# Patient Record
Sex: Female | Born: 1960 | Race: Black or African American | Hispanic: No | Marital: Married | Smoking: Never smoker
Health system: Southern US, Community
[De-identification: ages and names within clinical notes are randomized; demographics above are authoritative.]

## PROBLEM LIST (undated history)

## (undated) DIAGNOSIS — K529 Noninfective gastroenteritis and colitis, unspecified: Secondary | ICD-10-CM

---

## 1997-07-12 ENCOUNTER — Inpatient Hospital Stay (HOSPITAL_COMMUNITY): Admission: AD | Admit: 1997-07-12 | Discharge: 1997-07-14 | Payer: Self-pay | Admitting: Obstetrics and Gynecology

## 2001-12-23 ENCOUNTER — Encounter: Payer: Self-pay | Admitting: Family Medicine

## 2001-12-23 ENCOUNTER — Encounter: Admission: RE | Admit: 2001-12-23 | Discharge: 2001-12-23 | Payer: Self-pay | Admitting: Family Medicine

## 2002-01-20 ENCOUNTER — Other Ambulatory Visit: Admission: RE | Admit: 2002-01-20 | Discharge: 2002-01-20 | Payer: Self-pay | Admitting: Family Medicine

## 2002-02-07 ENCOUNTER — Encounter (INDEPENDENT_AMBULATORY_CARE_PROVIDER_SITE_OTHER): Payer: Self-pay | Admitting: *Deleted

## 2002-02-07 ENCOUNTER — Ambulatory Visit (HOSPITAL_COMMUNITY): Admission: RE | Admit: 2002-02-07 | Discharge: 2002-02-07 | Payer: Self-pay | Admitting: Gastroenterology

## 2005-05-01 ENCOUNTER — Encounter: Admission: RE | Admit: 2005-05-01 | Discharge: 2005-05-01 | Payer: Self-pay | Admitting: Family Medicine

## 2007-11-29 ENCOUNTER — Encounter: Admission: RE | Admit: 2007-11-29 | Discharge: 2007-11-29 | Payer: Self-pay | Admitting: Family Medicine

## 2008-02-12 ENCOUNTER — Other Ambulatory Visit: Admission: RE | Admit: 2008-02-12 | Discharge: 2008-02-12 | Payer: Self-pay | Admitting: Family Medicine

## 2008-02-14 ENCOUNTER — Encounter: Admission: RE | Admit: 2008-02-14 | Discharge: 2008-02-14 | Payer: Self-pay | Admitting: Family Medicine

## 2008-06-01 ENCOUNTER — Encounter: Admission: RE | Admit: 2008-06-01 | Discharge: 2008-06-01 | Payer: Self-pay | Admitting: Family Medicine

## 2008-06-02 ENCOUNTER — Encounter: Admission: RE | Admit: 2008-06-02 | Discharge: 2008-06-02 | Payer: Self-pay | Admitting: Family Medicine

## 2008-07-01 ENCOUNTER — Ambulatory Visit (HOSPITAL_BASED_OUTPATIENT_CLINIC_OR_DEPARTMENT_OTHER): Admission: RE | Admit: 2008-07-01 | Discharge: 2008-07-01 | Payer: Self-pay | Admitting: General Surgery

## 2008-07-01 ENCOUNTER — Encounter (INDEPENDENT_AMBULATORY_CARE_PROVIDER_SITE_OTHER): Payer: Self-pay | Admitting: General Surgery

## 2008-07-01 ENCOUNTER — Encounter: Admission: RE | Admit: 2008-07-01 | Discharge: 2008-07-01 | Payer: Self-pay | Admitting: General Surgery

## 2008-07-10 ENCOUNTER — Emergency Department (HOSPITAL_COMMUNITY): Admission: EM | Admit: 2008-07-10 | Discharge: 2008-07-10 | Payer: Self-pay | Admitting: Emergency Medicine

## 2008-07-21 ENCOUNTER — Encounter: Admission: RE | Admit: 2008-07-21 | Discharge: 2008-07-21 | Payer: Self-pay | Admitting: Family Medicine

## 2008-07-22 ENCOUNTER — Inpatient Hospital Stay (HOSPITAL_COMMUNITY): Admission: EM | Admit: 2008-07-22 | Discharge: 2008-07-25 | Payer: Self-pay | Admitting: Emergency Medicine

## 2008-07-22 ENCOUNTER — Ambulatory Visit: Payer: Self-pay | Admitting: Internal Medicine

## 2008-07-23 ENCOUNTER — Encounter: Payer: Self-pay | Admitting: Internal Medicine

## 2008-07-28 ENCOUNTER — Ambulatory Visit: Payer: Self-pay | Admitting: Internal Medicine

## 2008-07-28 DIAGNOSIS — J939 Pneumothorax, unspecified: Secondary | ICD-10-CM | POA: Insufficient documentation

## 2008-07-28 DIAGNOSIS — J82 Pulmonary eosinophilia, not elsewhere classified: Secondary | ICD-10-CM

## 2008-07-28 DIAGNOSIS — J930 Spontaneous tension pneumothorax: Secondary | ICD-10-CM

## 2008-08-13 ENCOUNTER — Telehealth: Payer: Self-pay | Admitting: Internal Medicine

## 2008-08-28 ENCOUNTER — Ambulatory Visit: Payer: Self-pay | Admitting: Internal Medicine

## 2008-08-28 DIAGNOSIS — R05 Cough: Secondary | ICD-10-CM

## 2008-08-28 DIAGNOSIS — M7989 Other specified soft tissue disorders: Secondary | ICD-10-CM

## 2009-06-02 ENCOUNTER — Encounter: Admission: RE | Admit: 2009-06-02 | Discharge: 2009-06-02 | Payer: Self-pay | Admitting: Family Medicine

## 2009-07-09 ENCOUNTER — Emergency Department (HOSPITAL_COMMUNITY): Admission: EM | Admit: 2009-07-09 | Discharge: 2009-07-09 | Payer: Self-pay | Admitting: Emergency Medicine

## 2009-09-30 IMAGING — CR DG CHEST 1V PORT
1 series · 1 of 1 positions shown · non-contrast
Comparison: Chest CT 07/22/2008. Chest x-ray 07/21/2008.

CLINICAL DATA: Pneumonia.  Status post bronchoscopy.

PORTABLE CHEST - 1 VIEW

[view not recorded]
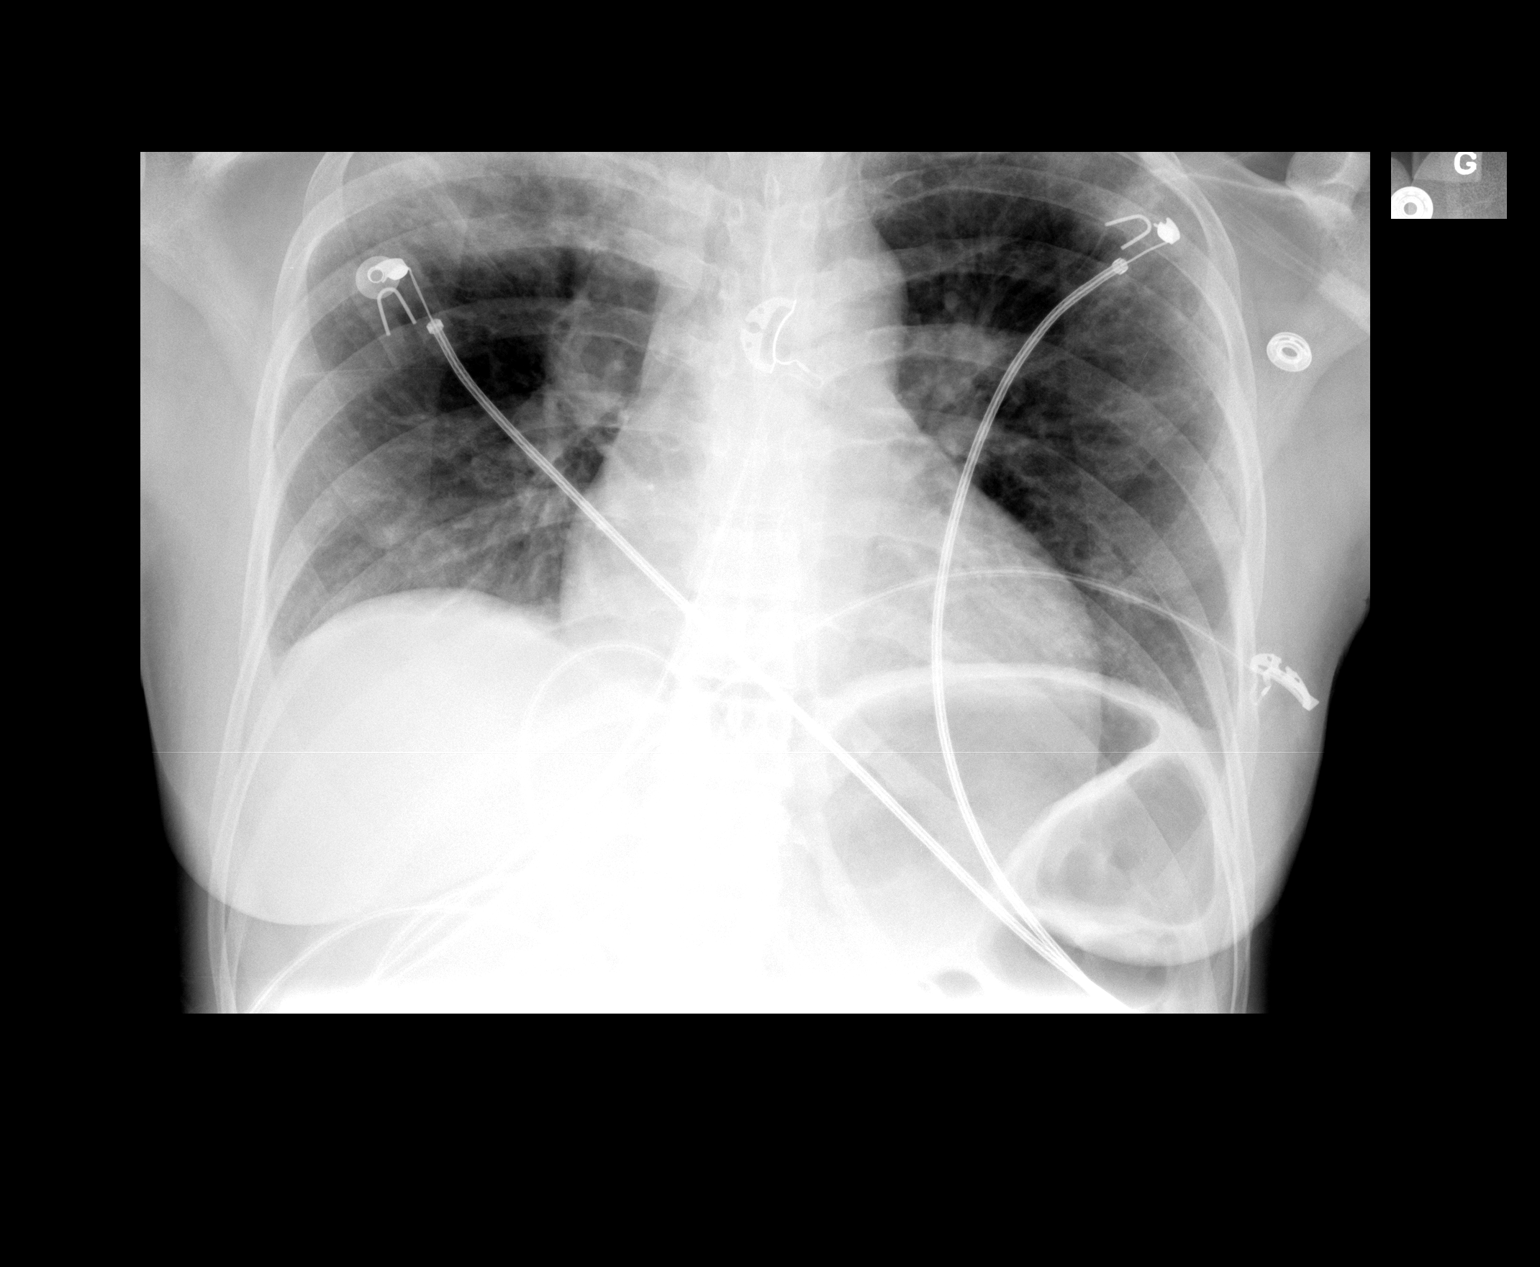

[1 of 1 positions shown; findings below may reference images not displayed]

FINDINGS: Slight interval decrease in size of left sided
pneumothorax now approximately 5%.

Progressive consolidation right upper lung zone.  No right-sided
pneumothorax.

Heart appears slightly prominent which may be related to AP
magnification.
IMPRESSION: Further progressive consolidation right upper lung.  No right-sided
pneumothorax status post bronchoscopy.

Slight decrease in size of left-sided pneumothorax now proximally
5%.

## 2009-12-17 ENCOUNTER — Other Ambulatory Visit: Admission: RE | Admit: 2009-12-17 | Discharge: 2009-12-17 | Payer: Self-pay | Admitting: Family Medicine

## 2010-05-13 ENCOUNTER — Ambulatory Visit (HOSPITAL_COMMUNITY)
Admission: RE | Admit: 2010-05-13 | Discharge: 2010-05-13 | Payer: Self-pay | Source: Home / Self Care | Attending: Obstetrics and Gynecology | Admitting: Obstetrics and Gynecology

## 2010-05-13 LAB — CBC
Hemoglobin: 10.8 g/dL — ABNORMAL LOW (ref 12.0–15.0)
MCH: 25 pg — ABNORMAL LOW (ref 26.0–34.0)
RBC: 4.32 MIL/uL (ref 3.87–5.11)
RDW: 14.5 % (ref 11.5–15.5)
WBC: 8.7 10*3/uL (ref 4.0–10.5)

## 2010-05-25 NOTE — Op Note (Signed)
  [name], [name]               ACCOUNT NO.:  [id number]  MEDICAL RECORD NO.:  [id number]          PATIENT TYPE:  AMB  LOCATION:  SDC                           FACILITY:  [facility]  PHYSICIAN:  Maxie Better, M.D.DATE OF BIRTH:  Feb 28, 1961  DATE OF PROCEDURE:  05/13/2010 DATE OF DISCHARGE:  05/13/2010                              OPERATIVE REPORT   PREOPERATIVE DIAGNOSES: 1. Menorrhagia. 2. Submucosal fibroid.  PROCEDURE:  Diagnostic hysteroscopy, D and C, NovaSure endometrial ablation.  POSTOPERATIVE DIAGNOSES: 1. Endometrial polyps. 2. Menorrhagia. 3. Submucosal fibroid.  SURGEON:  Maxie Better, MD  ASSISTANT:  None.  ANESTHESIA:  General, paracervical block.  PROCEDURE IN DETAIL:  Under adequate general anesthesia, the patient was placed in the dorsal lithotomy position.  She was sterilely prepped and draped in the usual fashion.  Bladder was catheterized for small amount of urine.  Examination under anesthesia revealed anteverted bulky uterus.  No adnexal masses could be appreciated.  A bivalve speculum was placed in vagina.  A single-tooth tenaculum was placed on the anterior lip of the cervix.  This uterus was then sounded to 10 cm.  The endocervical canal measured 4 cm.  The diagnostic hysteroscope was then utilized to inspect the uterine cavity, irregular endometrium was noted and polypoid in nature.  Tubal ostia could be seen.  The hysteroscope was removed.  The cavity was then curetted for the large amount of tissue.  The cervix was then further dilated.  The NovaSure endometrial ablation apparatus was then inserted.  Cavity width 4.5 was noted.  The testing was then done, power of 111 was utilized with 1 minute and 6 seconds of ablation done without incident.  The endometrial ablation apparatus was then removed.  The cavity was then inspected and good endometrial ablation was noted throughout at which point the procedure was terminated by removing  all instruments from the vagina.  Specimen was endometrial curettings, sent to Pathology. Estimated blood loss was minimal.  Complications none.  Fluid deficit was 130 mL.  Sponges and instrument count  x2 was correct.  The patient tolerated the procedure well, was transferred to recovery room in stable condition.     Maxie Better, M.D.     [place]/MEDQ  D:  05/13/2010  T:  05/14/2010  Job:  [id number]  Electronically Signed by Nena Jordan Emnet Monk M.D. on 05/25/2010 07:55:41 AM

## 2010-06-07 ENCOUNTER — Other Ambulatory Visit: Payer: Self-pay | Admitting: Family Medicine

## 2010-06-07 DIAGNOSIS — Z1231 Encounter for screening mammogram for malignant neoplasm of breast: Secondary | ICD-10-CM

## 2010-06-20 ENCOUNTER — Ambulatory Visit
Admission: RE | Admit: 2010-06-20 | Discharge: 2010-06-20 | Disposition: A | Discharge status: Home / Self Care | Payer: Managed Care, Other (non HMO) | Source: Ambulatory Visit | Attending: Family Medicine | Admitting: Family Medicine

## 2010-06-20 DIAGNOSIS — Z1231 Encounter for screening mammogram for malignant neoplasm of breast: Secondary | ICD-10-CM

## 2010-07-27 LAB — APTT: aPTT: 41 seconds — ABNORMAL HIGH (ref 24–37)

## 2010-07-27 LAB — MISCELLANEOUS TEST

## 2010-07-27 LAB — LEGIONELLA PROFILE(CULTURE+DFA/SMEAR)

## 2010-07-27 LAB — FUNGUS CULTURE W SMEAR: Fungal Smear: NONE SEEN

## 2010-07-27 LAB — DIFFERENTIAL
Basophils Relative: 0 % (ref 0–1)
Basophils Relative: 0 % (ref 0–1)
Eosinophils Absolute: 5.7 10*3/uL — ABNORMAL HIGH (ref 0.0–0.7)
Eosinophils Relative: 35 % — ABNORMAL HIGH (ref 0–5)
Eosinophils Relative: 39 % — ABNORMAL HIGH (ref 0–5)
Lymphocytes Relative: 11 % — ABNORMAL LOW (ref 12–46)
Lymphocytes Relative: 12 % (ref 12–46)
Lymphocytes Relative: 9 % — ABNORMAL LOW (ref 12–46)
Monocytes Absolute: 0.9 10*3/uL (ref 0.1–1.0)
Monocytes Relative: 6 % (ref 3–12)
Monocytes Relative: 6 % (ref 3–12)
Neutro Abs: 6 10*3/uL (ref 1.7–7.7)
Neutro Abs: 6.6 10*3/uL (ref 1.7–7.7)
Neutrophils Relative %: 42 % — ABNORMAL LOW (ref 43–77)
Neutrophils Relative %: 50 % (ref 43–77)

## 2010-07-27 LAB — CULTURE, RESPIRATORY W GRAM STAIN

## 2010-07-27 LAB — BASIC METABOLIC PANEL
BUN: 4 mg/dL — ABNORMAL LOW (ref 6–23)
BUN: 8 mg/dL (ref 6–23)
CO2: 25 mEq/L (ref 19–32)
CO2: 26 mEq/L (ref 19–32)
Calcium: 8.3 mg/dL — ABNORMAL LOW (ref 8.4–10.5)
Calcium: 8.6 mg/dL (ref 8.4–10.5)
Calcium: 8.9 mg/dL (ref 8.4–10.5)
Chloride: 103 mEq/L (ref 96–112)
Creatinine, Ser: 0.52 mg/dL (ref 0.4–1.2)
Creatinine, Ser: 0.71 mg/dL (ref 0.4–1.2)
Creatinine, Ser: 0.81 mg/dL (ref 0.4–1.2)
GFR calc Af Amer: >60 mL/min (ref >60)
GFR calc Af Amer: >60 mL/min (ref >60)
GFR calc Af Amer: >60 mL/min (ref >60)
GFR calc non Af Amer: >60 mL/min (ref >60)
GFR calc non Af Amer: >60 mL/min (ref >60)
GFR calc non Af Amer: >60 mL/min (ref >60)
Glucose, Bld: 95 mg/dL (ref 70–99)
Potassium: 3.4 mEq/L — ABNORMAL LOW (ref 3.5–5.1)
Sodium: 136 mEq/L (ref 135–145)
Sodium: 138 mEq/L (ref 135–145)

## 2010-07-27 LAB — CBC
HCT: 35.2 % — ABNORMAL LOW (ref 36.0–46.0)
Hemoglobin: 10.1 g/dL — ABNORMAL LOW (ref 12.0–15.0)
Hemoglobin: 11.5 g/dL — ABNORMAL LOW (ref 12.0–15.0)
MCHC: 32.7 g/dL (ref 30.0–36.0)
Platelets: 326 10*3/uL (ref 150–400)
Platelets: 334 10*3/uL (ref 150–400)
RBC: 3.39 MIL/uL — ABNORMAL LOW (ref 3.87–5.11)
RBC: 3.49 MIL/uL — ABNORMAL LOW (ref 3.87–5.11)
RBC: 4.03 MIL/uL (ref 3.87–5.11)
RDW: 13.8 % (ref 11.5–15.5)
WBC: 14 10*3/uL — ABNORMAL HIGH (ref 4.0–10.5)
WBC: 14.5 10*3/uL — ABNORMAL HIGH (ref 4.0–10.5)
WBC: 14.6 10*3/uL — ABNORMAL HIGH (ref 4.0–10.5)

## 2010-07-27 LAB — PROTIME-INR: Prothrombin Time: 16.3 seconds — ABNORMAL HIGH (ref 11.6–15.2)

## 2010-07-27 LAB — BODY FLUID CELL COUNT WITH DIFFERENTIAL
Eos, Fluid: 80 %
Monocyte-Macrophage-Serous Fluid: 9 % — ABNORMAL LOW (ref 50–90)
Neutrophil Count, Fluid: 8 % (ref 0–25)
Total Nucleated Cell Count, Fluid: 3995 cu mm — ABNORMAL HIGH (ref 0–1000)

## 2010-07-27 LAB — AFB CULTURE WITH SMEAR (NOT AT ARMC): Acid Fast Smear: NONE SEEN

## 2010-07-27 LAB — LEGIONELLA ANTIGEN, URINE

## 2010-07-27 LAB — IGE: IgE (Immunoglobulin E), Serum: 106 IU/mL (ref 0.0–180.0)

## 2010-07-27 LAB — PATHOLOGIST SMEAR REVIEW

## 2010-07-27 LAB — STREP PNEUMONIAE URINARY ANTIGEN: Strep Pneumo Urinary Antigen: NEGATIVE

## 2010-07-28 LAB — DIFFERENTIAL
Eosinophils Absolute: 0.8 10*3/uL — ABNORMAL HIGH (ref 0.0–0.7)
Lymphs Abs: 0.9 10*3/uL (ref 0.7–4.0)
Monocytes Absolute: 0.6 10*3/uL (ref 0.1–1.0)
Monocytes Relative: 7 % (ref 3–12)
Neutro Abs: 6 10*3/uL (ref 1.7–7.7)
Neutrophils Relative %: 72 % (ref 43–77)

## 2010-07-28 LAB — BASIC METABOLIC PANEL
Chloride: 105 mEq/L (ref 96–112)
Creatinine, Ser: 0.64 mg/dL (ref 0.4–1.2)
GFR calc Af Amer: >60 mL/min (ref >60)
Potassium: 4.9 mEq/L (ref 3.5–5.1)
Sodium: 138 mEq/L (ref 135–145)

## 2010-07-28 LAB — CBC
Hemoglobin: 11.5 g/dL — ABNORMAL LOW (ref 12.0–15.0)
MCV: 89.3 fL (ref 78.0–100.0)
RBC: 3.86 MIL/uL — ABNORMAL LOW (ref 3.87–5.11)
WBC: 8.4 10*3/uL (ref 4.0–10.5)

## 2010-08-30 NOTE — Op Note (Signed)
NAMESAFIRE, GORDIN NO.:  [id number]   MEDICAL RECORD NO.:  [id number]          PATIENT TYPE:  INP   LOCATION:  6702                         FACILITY:  [facility]   PHYSICIAN:  Kalman Shan, MD   DATE OF BIRTH:  [birth year]   DATE OF PROCEDURE:  07/23/2008  DATE OF DISCHARGE:                               OPERATIVE REPORT   SURGEON:  Kalman Shan, MD   TYPE OF PROCEDURE:  Bronchoscopy with biopsies and lavage.   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   PREPROCEDURE ASSESSMENT:  History and physical was reviewed less than 24  days old.  ASA class I.  Airway assessment class Mallampati 1.  Vital  signs were stable, height 67 inches, weight 131 pounds, blood pressure  122/79, pulse of 88, respiratory rate of 24 prior to procedure exam was  within normal limits.  The patient's n.p.o. status was confirmed.  PT,  PTT, and creatinine and albumin all normal.   The patient is considered low risk for complications for bronchoscopy  and was deemed to undergo procedure.   SEDATION PLAN:  Topical lidocaine with moderate sedation using fentanyl  and Versed.   RISKS:  Risks of the procedure include pneumothorax, bleeding, sedation  complications, this was all explained.  The possibility of not being  able to get diagnosis was also explained.   CONSENT:  Signed informed consent obtained.   BRONCHOSCOPY NOTE:  The scope was introduced to the right naris at 10:20  a.m. on July 23, 2008.  The vocal cords looked normal.  The false vocal  cord was normal.  Epiglottis was normal.  The trachea was navigated.  A  salt-and-pepper appearance of the trachea was noted.  Fine white  granules were noted.  These were too small to biopsy.  I do not know if  this was from thrush are not.   Then, a detailed airway exam was done.  The carina was sharp.   The right  side exam was essentially normal except for the right upper lobe  subsegmental bronchi looked thick and edematous.  Rest of the airway  exam was completely normal.  No endobronchial lesions noted.   Following this, bronchoalveolar lavage using 40 mL x3 of normal saline  was done of the right upper lobe apical segment with very good returns  of 50 mL.  This was being sent for analysis.  Following this,  transbronchial biopsy under fluoroscopy of the right upper lobe apical  segment was done, 5 pieces were removed.  These were deemed to be  adequate in nature.  Following this, endobronchial biopsies of the right  upper lobe takeoff of the right subsegmental airway was done and 3  pieces were obtained.  This is to look for any airway histology showing  eosinophilia.  Following this, a brush biopsy of the right upper lobe  apical segment was sent for cytology.   POSTPROCEDURE PLAN:  1. Right upper lobe apical segment bronchoalveolar lavage for  cytology, Gram stain, cell count, differential, and microbiological      analysis.  2. Transbronchial biopsies for histopathology.  3. Airway, right upper lobe takeoff endobronchial biopsies for      histology.  4. Brush biopsy of the right upper lobe apical segment consolidation      for cytology.   POSTPROCEDURE NOTE:  The scope was withdrawn at 10:40.  Procedure time  was 20 minutes.  Total fentanyl used was 100 mcg.  Versed used was 5 mg.  Fluoroscopy time was 1 minute 18 seconds.  Estimated blood loss was  none.   COMPLICATIONS:  None.   The patient will be sent to recovery room.      Kalman Shan, MD  Electronically Signed     MR/MEDQ  D:  07/23/2008  T:  07/24/2008  Job:  [id number]

## 2010-08-30 NOTE — Consult Note (Signed)
[name], [name] NO.:  [id number]   MEDICAL RECORD NO.:  [id number]          PATIENT TYPE:  INP   LOCATION:  6729                         FACILITY:  [facility]   PHYSICIAN:  James L. Malon Kindle., M.D.DATE OF BIRTH:  [birth year]   DATE OF CONSULTATION:  07/24/2008  DATE OF DISCHARGE:                                 CONSULTATION   REQUESTING PHYSICIAN:  Dr. Marchelle Gearing   PRIMARY CARE PHYSICIAN:  Renaye Rakers, MD   PRIMARY GASTROENTEROLOGIST:  Graylin Shiver, MD   HISTORY:  [place] female who has had ulcerative colitis dating  back to 2001.  It was fairly mild on her treatment up until this time.  This consisted only of Canasa Suppositories.  She saw Dr. Evette Cristal back in  the office in February and apparently at that time was started on Lialda  at dose of 2.4 g daily.  This is the first time that she had taken  Lialda.  She did fine and her rectal bleeding probably resolved within  several days of starting Lialda and her colitis was doing wonderfully.  Approximately 3 weeks later, she developed a cough, it was initially  felt to be an upper respiratory infection and became persistent and  productive.  Became progressively worse and the chest x-ray revealed a  small pneumothorax.  The patient was sent to the emergency room.  She  had been complaining of persistent severe coughing.  It was originally  thought she may have H1N1 disease.  She was having night sweats, fevers,  and felt horrible.  Workup included PPD, which was negative and labs  which revealed an eosinophil count of 35% and a white count of 14.  She  subsequently was found to have bilateral upper lobe infiltrates and  underwent a bronchoscopy, revealed severe eosinophilic infiltration.  We  were asked to see her regarding the possibility of eosinophilic  pneumonia due to mesalamine.  Review of the literature shows that this  is extraordinarily rare but has been reported and does tend to respond  to  steroids and withdrawal of mesalamine.  Only a few cases have been  reported in the world wide literature.  The patient does note that since  starting mesalamine, her ulcerative colitis has been completely  asymptomatic.   MEDICATIONS ON ADMISSION:  Lialda 2.4 g daily.   ALLERGIES:  She has no drug allergies.   PAST MEDICAL HISTORY:  Ulcerative colitis since 2001, primarily  proctitis.  She has had anemia.   No surgeries other than a tubal ligation.   She still is currently having periods.   FAMILY HISTORY:  Negative for inflammatory bowel disease or cancer.   SOCIAL HISTORY:  She is married, lives with her husband.   PHYSICAL EXAMINATION:  VITAL SIGNS:  Unremarkable.  GENERAL:  Very pleasant African American female.  She is not  particularly short of breath, does have O2 in place.  EYES:  Sclerae nonicteric.  LUNGS:  Clear anteriorly and posteriorly.  HEART:  Regular rate and rhythm without murmurs or gallops.  ABDOMEN:  Soft, entirely nontender, and nondistended.  ASSESSMENT:  1. 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  Her PPD is negative, and      she has had no exposure to anything else.  This has clearly been      reported in the literature.  She clearly needs to be off of      mesalamine  2. Ulcerative colitis.  Prior history appears to be primarily distal      disease.  She has been started on high-dose prednisone, which      presumably will be tapered as her lung disease improves, and I      think at that time she will possibly flare, questionable how to      treat her ulcerative colitis then.   RECOMMENDATION:  At this point, we would add no other treatment for  ulcerative colitis other than the prednisone which she currently is  taking.  We would have her follow up with Dr. Evette Cristal approximately 1  month after discharge.  If she begins to have a flare of her disease  after her steroids are  tapered, I think a colonoscopy to determine the  extent of disease would be reasonable.  If her disease remains as it has  in the distal colon, then she may be a candidate for chronic ongoing  steroid suppositories or foams.           ______________________________  Llana Aliment Malon Kindle., M.D.     Waldron Session  D:  07/24/2008  T:  07/25/2008  Job:  [id number]   cc:   Dr. Sophronia Simas, M.D.  Graylin Shiver, M.D.

## 2010-08-30 NOTE — Op Note (Signed)
[name], SCHRYVER               ACCOUNT NO.:  [id number]   MEDICAL RECORD NO.:  [id number]          PATIENT TYPE:  AMB   LOCATION:  DSC                          FACILITY:  [facility]   PHYSICIAN:  Almond Lint, MD       DATE OF BIRTH:  05-29-1960   DATE OF PROCEDURE:  07/01/2008  DATE OF DISCHARGE:                               OPERATIVE REPORT   PREOPERATIVE DIAGNOSIS:  Left abnormal mammogram.   POSTOPERATIVE DIAGNOSIS:  Left abnormal mammogram.   PROCEDURE:  Left needle localized breast biopsy.   SURGEON:  Almond Lint, MD   ASSISTANT:  None.   ANESTHESIA:  General and local.   FINDINGS:  The complete posterior calcifications were included in the  specimen and a portion of the anterior calcifications were seen in the  specimen, so an additional medial margin was taken.   SPECIMENS:  Left breast biopsy and 2 additional medial margins to  pathology.   ESTIMATED BLOOD LOSS:  10 mL.   COMPLICATIONS:  None known.   PROCEDURE:  [name] was identified in the holding area and taken to  operating room where she was placed supine on the operating room table.  General anesthesia was induced.  The patient's left breast was prepped  and draped in sterile fashion.  The wires were cut shorter.  The time-  out was performed according to the surgical safety checklist.  A  curvilinear incision was drawn between the 2 wires, that would be  included if a mastectomy was performed.  The incision was made with #15  blade and skin flaps were created by elevating the skin with skin hooks  and taking the dissection medial and lateral with the Bovie  electrocautery.  The wires were pulled through the skin incision and a  core of tissue was taken around both the wires.  The tip of the  posterior wire was encountered, but the tip was past the specimen.  The  distal portion of the anterior wire was encountered and additional  tissue was taken.  The area was cored out with a Bovie and then  additional medial margin was taken.  Radiology reported that some of the  anterior calcifications were not present in the specimen.  An additional  medial margin was taken.  An Allis clamp was used to elevate the new  medial margin and this was taken with the Bovie.  The cavity was  irrigated and  hemostasis was achieved with Bovie electrocautery.  The skin was then  closed using 3-0 Vicryl interrupted sutures and 4-0 Monocryl in a  running subcuticular fashion.  The wound was then cleaned, dried and  dressed with Steri-Strips, gauze, and tape.  The patient was awakened  from anesthesia and taken to PACU in stable condition.      Almond Lint, MD  Electronically Signed     FB/MEDQ  D:  07/01/2008  T:  07/02/2008  Job:  [id number]

## 2010-08-30 NOTE — Discharge Summary (Signed)
[name], [name] NO.:  [id number]   MEDICAL RECORD NO.:  [id number]          PATIENT TYPE:  INP   LOCATION:  6729                         FACILITY:  [facility]   PHYSICIAN:  Gardiner Barefoot, MD    DATE OF BIRTH:  1960-08-24   DATE OF ADMISSION:  07/21/2008  DATE OF DISCHARGE:  07/25/2008                               DISCHARGE SUMMARY   PRIMARY CARE PHYSICIAN:  Renaye Rakers, MD   PRIMARY GASTROENTEROLOGIST:  Graylin Shiver, MD   PULMONOLOGIST:  Kalman Shan, MD   HISTORY OF PRESENT ILLNESS:  Briefly, please see previously dictated  history and physical from Dr. Orvan Falconer on July 21, 2008.  Briefly, this  is a 50 year old African American female who has a history of ulcerative  colitis on mesalamine, who had essentially productive cough, fever, and  chills for an extended period of time.  She had reported this over at  least 6-week period.  She had been treated for symptoms; however, they  persisted.   DISCHARGE DIAGNOSES:  1. 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  2. Ulcerative colitis.   MEDICATIONS AT DISCHARGE:  Prednisone 60 mg daily for 1 month.  I was  recommended that she stay on this for at least 1-3 months by Pulmonary  and then we will do slow wean.  Dr. Jane Canary office will taper this  dose.   HOSPITAL COURSE:  1. Eosinophilic lung disease.  The patient improved during her      hospitalization on steroids particularly once the Mesalamine was      stopped.  At discharge, she was not hypoxic and feeling well.      During her workup, it was noted on her CT scan that she had these      diffuse infiltrates and pleural effusions and bronchoscopy was      performed by Dr. Marchelle Gearing with appropriate cultures and pathology      and it did show some lung collapse and the results were significant      for 3995 white cells with 80% being eosinophils.  Pathology also      confirmed the finding of eosinophils.  As the patient had no  history of travel or any likelihood of being exposed to unusual      parasites, it was felt to be secondary to some other inflammatory      disease.  It is known that there are some case reports of      eosinophilic lung disease from mesalamine, so this was determined      to be the most likely etiology.  In fact, she did improve after      stopping that along with steroids.  She is to be on an extended      course of prednisone to treat this and which will be managed by      pulmonary office, Dr. Marchelle Gearing.  All other cultures from the      bronchoscopy were negative at discharge including negative AFB      smear.  Other testings she had including HIV antibody which was  negative, IgE which was within normal limits at 106, Legionella      which was negative.  The patient does have a followup appointment      on April 12 with Dr. Jane Canary office.  2. Ulcerative colitis.  The patient does have ulcerative colitis.  At      this time, obviously her mesalamine will be listed as an allergy      and she will not take that ever again.  She is in good control over      her ulcerative colitis on the prednisone, and therefore, no      adjunctive therapy is needed at this time.  However, as she will      followup with her gastroenterologist, who will at the time of her      weaning of steroids, will consider other options.  3. Disposition.  The patient is discharged in good condition, is eager      to go home.  She is nonhypoxic and not short of breath or have been      in any significant symptoms, feeling quite well.  She has also been      told to follow up with the primary care physician in 1-2 weeks.      Gardiner Barefoot, MD  Electronically Signed     [facility]/MEDQ  D:  07/25/2008  T:  07/26/2008  Job:  [id number]   cc:   Graylin Shiver, M.D.  Kalman Shan, MD  Renaye Rakers, M.D.

## 2010-08-30 NOTE — Consult Note (Signed)
[name], ESCUTIA NO.:  [id number]   MEDICAL RECORD NO.:  [id number]          PATIENT TYPE:  INP   LOCATION:  6702                         FACILITY:  [facility]   PHYSICIAN:  Kalman Shan, MD   DATE OF BIRTH:  07-Mar-1961   DATE OF CONSULTATION:  07/22/2008  DATE OF DISCHARGE:                                 CONSULTATION   REQUESTING PHYSICIAN:  Consult was requested by Dr. [name] of  [facility].   REASON FOR CONSULTATION:  Eosinophilic pneumonia and left-sided  pneumothorax.   NOTE:  110 minutes inpatient pulmonary consultation.   HISTORY OF THE PRESENT ILLNESS:  This patient is a 50 year old pleasant  African American female with a history of ulcerative colitis for which  she is maintained on mesalamine probably since around January 2010.  She  describes of persistent cough, fever and chills since around June 11, 2008.  She recollects that around June 11, 2008 she got sick  with upper respiratory infection, which she describes as a cold.  At  that time there were some sick contacts at work; however, subsequently  she got slightly better from the respiratory infection, but developed a  cough and this cough has persisted throughout.  She then picked up  another upper respiratory infection and then the cough worsened.  Around  the middle of March 2010 [facility] her  care physician.  She received amoxicillin and another course of  antibiotic, symptoms have not improved.  She also, around the middle of  March 2010, started to develop fevers, night sweats and chills.  She  describes that every night she has drenching night sweats and she feels  subjectively feverish.  She is very concerned about her symptoms,  especially because all this started in February 2010 after across sick  contacts at work.  The other sick contacts at work have recovered, but  she still continues to have symptoms.   The patient  denies any nausea, vomiting, diarrhea, syncope, weakness,  hemoptysis, sputum production, edema, orthopnea, paroxysmal nocturnal  dyspnea, and rhinorrhea.  The cough she has is predominantly a dry cough  and it is made worse by talking and made better by some rest, but it is  basically present all 24 hours.  She does not currently associate her  symptoms to starting methylamine probably a few weeks or months before  the onset of her current illness.   Since she has had continued symptoms she had a chest x-ray done at her  primary care physician's office 2 days ago.  She was finally called  about it on July 21, 2008 and was told to come to the emergency room as  the chest x-ray showed bilateral upper lobe infiltrates and a small left-  sided pneumothorax.  Therefore, she has been admitted under [facility].  Laboratories reveal significant peripheral blood eosinophilia; and,  therefore we have been consulted.  Of note, she denies any wheezing or  asthma symptoms.   PAST MEDICAL HISTORY:  The patient's past medical history is significant  for:  1. Ulcerative colitis.  She states that she has had ulcerative colitis      since 2001.  She is under the care of Dr. Wandalee Ferdinand only recently.      She started on methylamine around January 2010.  Since then the      ulcerative colitis has completely been in remission; but, prior to      that she has had part many episodes of rectal bleeding.  Her CT      baseline heme  2. 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 Her baseline hemoglobin is      somewhere between 7 and 9 grams percent; most recent check was in      December 2009 when it was around 9 grams percent.  At that time she      was on iron, but she stopped her iron tablets around that time due      to constipation and black stools.  Her next hemoglobin check is due      on August 04, 2008.  3. The patient denies asthma, tuberculosis, strokes, coronary artery      disease, sarcoidosis, and  pneumonias.   Of note, for her ulcerative colitis she is taking methylamine four times  daily.   ALLERGIES:  No known drug allergies.   SOCIAL HISTORY:  The patient lives with her husband.  Her husband  smokes, but not in her presence.  She has four children and they do not  smoke.  She is married to her second husband; her first was promiscuous  therefore she got herself tested for HIV over 20 years ago, and this was  negative.  She states she has been faithful to her current husband and  feels she is at an extremely low risk for any HIV.  She denies any  tobacco, alcohol or illicit drug use.  She works as a Therapist, nutritional.   FAMILY HISTORY:  Mother died from breast cancer.  Father died of unknown  causes.   REVIEW OF SYSTEMS:  Detailed 13-point review of systems is carried out  and is all negative, except as per the history of present illness.   PAST SURGICAL HISTORY:  Status post tubal ligation.   PAST GYNECOLOGICAL HISTORY:  The patient has normal menstrual cycles.  She is not on any oral contraceptives.  Her last menstrual period was  the third week of March 2010.   EXPOSURE HISTORY:  The patient denies any exposure to dust, asbestos,  chemical fumes, pollen, dander, or mold at work or at home.  She does  not have any pet birds.   PHYSICAL EXAMINATION:  VITAL SIGNS:  Temperature was 99.5 in the  emergency room, pulse of 85, respiratory rate of 18, blood pressure of  119/82, and saturation 100% on 2 liters.  GENERAL APPEARANCE:  On general exam this is a pleasant female who looks  her stated age, and who is lying in bed coughing frequently, but not  otherwise in distress.  HEENT:  Pupils are otherwise equal and react.  NECK:  The neck reveals no cervical lymphadenopathy or thyromegaly.  No  JVP elevation.  CHEST:  The chest is clear to auscultation bilaterally.  No diminished  air entry on any side.  HEART:  Cardiovascular - regular rate and rhythm.  Normal  heart sounds.  No murmurs.  ABDOMEN:  The abdomen is scaphoid, soft and nontender.  EXTREMITIES:  The extremities reveal no cyanosis, no clubbing and no  pedal edema.  NEUROLOGIC EXAMINATION:  Central nervous system - alert and oriented x3.  Cranial nerves normal.  Motor exam is normal.  No focal neurologic  deficit.   LABORATORY DATA:  Chest x-ray showed bilateral upper lobe infiltrates  with a small left-sided pneumothorax of 10% or so.   Cell count; white count was 14.6 thousand and of this 45% were  neutrophils and 39% eosinophils.  Absolute eosinophil count was 5,700.  Hemoglobin 11.5 and platelet count 334,000.  LDH 114.  HIV is pending.  Chemistries were essentially normal with a sodium of 136, bicarb of 26,  a creatinine of 0.4 and a calcium of 8.9.  PTT was slightly elevated at  44 on June 30, 2008.   ASSESSMENT AND PLAN:  This is a 50 year old woman who has ulcerative  colitis, which is in remission with the help of methylamine that was  started in January 2010.  She also has chronic anemia due to her  ulcerative colitis with a baseline hemoglobin of 7-9 grams percent.  She  had some sick contact exposure towards the end of February 2010.  Since  then she has had persistent cough; and, also since mid March 2010 she  still has had fever and sweats without any weight loss.  The  investigations reveal bilateral upper lobe infiltrates, right greater  than left, with a small left-sided pneumothorax.  In addition, she has  leukocytosis of 14,600 with significant peripheral eosinophilia of 39%.  Of note, she is a nonsmoker.   Differential diagnosis include eosinophilic lung diseases, in  particular, histiocytosis X can present with upper lobe infiltrates in a  woman of this age and a pneumothorax from rupture of the cyst.  The only  feature that does not fit with this is the fact that she is a nonsmoker.  Chronic eosinophilic pneumonia is unlikely because her symptoms have  not  been chronic and she does not have classic x-ray features.  Acute  eosinophilic pneumonia is possible, but, again, her x-rays did not show  diffuse pulmonary infiltrates.  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   Another possibility, especially for the pneumothorax, include a  Pneumocystis carinii pneumonia, but she deems herself as an extremely  low risk for human immunodeficiency virus and LDH is normal.  Another  possibility is tuberculosis cavity rupture and pneumothorax, but she has  not traveled outside.  Topical pulmonary eosinophilia is a possibility,  but, again, she has not traveled outside the country.  She also does not  have any features that fit in with __________ syndrome.   One other possibility is drug-induced disease.  It is interesting that  her symptoms followed the commencement of methylamine for ulcerative  colitis.  In review of the literature for ulcerative colitis there have  been rare reports of eosinophilic pneumonia with methylamine; therefore,  this is poor possible.   PLAN:  1. Avoid all steroid and antibiotics until a definitive diagnosis is      established.  2. Place PPD.  3. Rule out for tuberculosis  4. Check IgE.  5. Check serum precipitins for Aspergillus.  6. Obtain CT scan of the chest.  7. Depending on the results of the above I will perform      bronchoalveolar lavage with bronchoscopy.   Thank you for this interesting consult.  I will continue to follow.      Kalman Shan, MD  Electronically Signed     MR/MEDQ  D:  07/22/2008  T:  07/22/2008  Job:  [id number]   cc:   Graylin Shiver, M.D.  Annia Friendly. Loleta Chance, MD

## 2010-08-30 NOTE — H&P (Signed)
[name], [name] NO.:  [id number]   MEDICAL RECORD NO.:  [id number]          PATIENT TYPE:  EMS   LOCATION:  MAJO                         FACILITY:  [facility]   PHYSICIAN:  [name], M.D. DATE OF BIRTH:  [birth year]   DATE OF ADMISSION:  07/21/2008  DATE OF DISCHARGE:                              HISTORY & PHYSICAL   PRIMARY CARE PHYSICIAN:  Annia Friendly. Loleta Chance, M.D.   GASTROENTEROLOGIST:  Graylin Shiver, M.D.   CHIEF COMPLAINT:  Worsening cough for six weeks.   HISTORY OF PRESENT ILLNESS:  This is a 50 year old African American lady  with a history of ulcerative colitis, maintained on mesalamine who was  diagnosed with an upper respiratory infection on February 25 and since  then has been having persistent cough productive of clear sputum,  episodic fever and chills, which has been getting progressively worse  despite repeated courses of antibiotics and various other treatments  given by her primary care physician and at Urgent Care centers.  Because  of the severe worsening over the past few days the patient was sent to  get a chest x-ray which revealed a small Pneumothorax and the patient  was sent to the emergency room to be evaluated.   The patient denies exposure to any sick contacts.  She denies any recent  travel.  She has not had a flu shot nor her H1 N1.  She has not had a  recent HIV test.  She did have remote history of being tested negative  for HIV many years ago.   The patient describes drenching night sweats almost every night, does  not have a thermometer to check her fever, but says she feels as if she  is having a fever and she often has chills.  The patient says since  February, many persons at her office have come down with upper  respiratory sickness, most of them over the past month.  Most of them  have completely recovered, but she continues to cough.   PAST MEDICAL HISTORY:  Ulcerative colitis.   MEDICATIONS:  Lialda 1.2 g four  times daily.   ALLERGIES:  No known drug allergies.   SOCIAL HISTORY:  Denies tobacco, alcohol, or illicit drug use.  Works as  a Neurosurgeon.  She is married, lives with her husband and  two children.   FAMILY HISTORY:  Significant for mother who died of cancer of the breast  at age 73 and her father who died of causes unknown.   REVIEW OF SYSTEMS:  On a 10-point review of systems other than noted  above, not significant.   PHYSICAL EXAMINATION:  GENERAL:  Pleasant, middle-aged, African American  lady, looks younger than her stated age, lying in bed, coughing  frequently but otherwise not distressed.  VITAL SIGNS:  Temperature is 99.5, pulse 85, respirations 18, blood  pressure 119/82.  She is saturating 100% on 2 L.  HEENT:  Pupils are round, equal, and reactive.  Mucous membranes are  pink and anicteric.  NECK:  No cervical lymphadenopathy or thyromegaly.  No jugular venous  distention.  CHEST:  Clear to auscultation bilaterally.  CARDIOVASCULAR:  Regular rhythm.  No murmur.  ABDOMEN:  Scaphoid, soft, and nontender.  EXTREMITIES:  Without edema.  She has 3+ pulses, equal bilaterally.  CENTRAL NERVOUS SYSTEM:  Cranial nerves II-XII are grossly intact.  She  has no focal neurologic deficit.   LABORATORY DATA:  White count 14.6, hemoglobin 11.5, platelets 334.  She  has 45% neutrophils, 39% eosinophils, with an absolute eosinophil count  of 5.7.  Basic metabolic panel is reviewed and is unremarkable, other  than a potassium of 3.4.   Two-view chest x-ray shows 10-15% left apical Pneumothorax, air space  disease in the right upper lobe, most consistent with pneumonia.  Cannot  exclude tuberculosis, although she has no adenopathy.  Possible small  pleural effusions.   ASSESSMENT:  1. Right upper lobe pneumonia.  2. Left-sided Pneumothorax.  3. Chronic pneumonia versus pneumonia superimposed on chronic upper      respiratory infection.  4. Differential includes  tuberculosis, pneumocystis pneumonia,      eosinophilic pneumonia, viral pneumonia, pulmonary parasitic      infection.   PLAN:  Will check an LDH and HIV serology on this lady.  Will also take  steps to rule out pulmonary tuberculosis.  Will place her on high flow  oxygen and treat with vancomycin and Avelox until further information  becomes available.  Will also give a course of Tamiflu and will discuss  her management with the pulmonary service.  Her eosinophilia may be a  reaction to the Pneumothorax or it may be a part of her primary  condition.  Will also treat with antihistamine.  Other plans as per  orders.      [name], M.D.  Electronically Signed     LC/MEDQ  D:  07/21/2008  T:  07/22/2008  Job:  [id number]   cc:   Annia Friendly. Loleta Chance, MD  Renaye Rakers, M.D.

## 2010-09-02 NOTE — Op Note (Signed)
   NAME:  [name], WHEELWRIGHT                        ACCOUNT NO.:  [id number]   MEDICAL RECORD NO.:  [id number]                   PATIENT TYPE:  AMB   LOCATION:  ENDO                                 FACILITY:  [facility]   PHYSICIAN:  Anselmo Rod, M.D.               DATE OF BIRTH:  [birth year]   DATE OF PROCEDURE:  02/07/2002  DATE OF DISCHARGE:                                 OPERATIVE REPORT   DATE OF BIRTH:  1960/09/10   PROCEDURE PERFORMED:  Esophagogastroduodenoscopy.   ENDOSCOPIST:  Charna Elizabeth, M.D.   INSTRUMENT USED:  Olympus video panendoscope.   INDICATIONS FOR PROCEDURE:  A 50 year old African-American female with a  history of rectal bleeding and severe anemia.  Hemoglobin down to 8.8 g/dL.  Colonoscopy was essentially unrevealing, rule out peptic ulcer disease,  esophagitis, gastritis, etc.   PREPROCEDURE PREPARATION:  Informed consent was procured from the patient.  The patient was fasted for 8 hours prior to the procedure.   PREPROCEDURE PHYSICAL:  The patient has stable vital signs.   NECK:  Supple.   CHEST:  Clear to auscultation.  S1, S2 regular.   ABDOMEN:  Soft with normal bowel sounds.   DESCRIPTION OF PROCEDURE:  The patient was placed in the left lateral  decubitus position and sedated with an additional 2 mg of Versed  intravenously.  Once the patient was adequately sedated and maintained on  low-flow oxygen and continuous cardiac monitoring, the Olympus video  panendoscope was advanced through the mouth piece, over the tongue into the  esophagus under direct vision.  The entire esophagus appeared normal with no  evidence of ring, stricture, masses, esophagitis or Barrett's mucosa.  The  scope was then advanced into the stomach.  A small hiatal hernia was seen on  high retroflexion.  The rest of the gastric mucosa and the proximal small  bowel appeared normal.   IMPRESSION:  Normal esophagogastroduodenoscopy, except for a small hiatal   hernia.    RECOMMENDATIONS:  1. Check CBC today.  2. Outpatient follow-up within the next two weeks with further     recommendations.                                                 Anselmo Rod, M.D.    JNM/MEDQ  D:  02/07/2002  T:  02/08/2002  Job:  [id number]   cc:   Jocelyn Lamer D. Pecola Leisure, M.D.

## 2010-09-02 NOTE — Op Note (Signed)
   NAME:  [name], [name]                        ACCOUNT NO.:  [id number]   MEDICAL RECORD NO.:  [id number]                   PATIENT TYPE:  AMB   LOCATION:  ENDO                                 FACILITY:  [facility]   PHYSICIAN:  Anselmo Rod, M.D.               DATE OF BIRTH:  1960/04/28   DATE OF PROCEDURE:  DATE OF DISCHARGE:                                 OPERATIVE REPORT   PROCEDURE PERFORMED:  Colonoscopy with biopsies.   ENDOSCOPIST:  Anselmo Rod, M.D.   INSTRUMENT USED:  Olympus video panendoscope.   INDICATIONS FOR PROCEDURE:  The patient is a 50 year old African-American  female with a history of anemia and rectal bleeding, hemoglobin down to 8.8  gm per dL, rule out colonic polyps, masses, hemorrhoids, inflammatory bowel  disease.   PREPROCEDURE PREPARATION:  Informed consent was secured from the patient.  The patient was fasted for eight hours prior to  the procedure and was  prepped with a bottle of magnesium citrate and a gallon of NuLytely the  night prior to the procedure.   PREPROCEDURE PHYSICAL:  The patient had stable vital signs. Neck was supple.  Chest clear to auscultation. S1, S2 regular. Abdomen soft with normal bowel  sounds.   DESCRIPTION OF PROCEDURE:  The patient was placed in the left lateral  decubitus position and sedated with 50 mg of Demerol and 6 mg of Versed  intravenously. Once the patient was adequately sedated and maintained on low  flow oxygen  and continuous cardiac monitoring, the Olympus video  colonoscope was advanced into the rectum to the cecum with difficulty.   The entire colonic mucosa appeared  healthy except mild nodular  changes  seen in the rectal mucosa. This was biopsied to rule out proctitis. Small  internal hemorrhoids were seen on retroflexion of the rectum. The rest of  the colonic mucosa appeared normal.   IMPRESSION:  1. A small patchy area of nodular appearing mucosa in the rectum, biopsied     for  pathology.  2. Small nonbleeding internal hemorrhoids.  3. Otherwise normal appearing left colon, transverse colon, right colon,     cecum and PI.    RECOMMENDATIONS:  1. Await pathology results.  2. Proceed with EGD.  3. Further recommendations will be made after EGD.                                                  Anselmo Rod, M.D.    JNM/MEDQ  D:  02/07/2002  T:  02/08/2002  Job:  [id number]   cc:   Jocelyn Lamer D. Pecola Leisure, M.D.

## 2012-11-13 ENCOUNTER — Other Ambulatory Visit: Payer: Self-pay

## 2012-11-13 DIAGNOSIS — Z1231 Encounter for screening mammogram for malignant neoplasm of breast: Secondary | ICD-10-CM

## 2012-12-02 ENCOUNTER — Ambulatory Visit
Admission: RE | Admit: 2012-12-02 | Discharge: 2012-12-02 | Disposition: A | Discharge status: Home / Self Care | Payer: Managed Care, Other (non HMO) | Source: Ambulatory Visit

## 2012-12-02 DIAGNOSIS — Z1231 Encounter for screening mammogram for malignant neoplasm of breast: Secondary | ICD-10-CM

## 2012-12-04 ENCOUNTER — Other Ambulatory Visit: Payer: Self-pay | Admitting: Family Medicine

## 2012-12-04 DIAGNOSIS — R928 Other abnormal and inconclusive findings on diagnostic imaging of breast: Secondary | ICD-10-CM

## 2012-12-17 ENCOUNTER — Ambulatory Visit
Admission: RE | Admit: 2012-12-17 | Discharge: 2012-12-17 | Disposition: A | Discharge status: Home / Self Care | Payer: Managed Care, Other (non HMO) | Source: Ambulatory Visit | Attending: Family Medicine | Admitting: Family Medicine

## 2012-12-17 DIAGNOSIS — R928 Other abnormal and inconclusive findings on diagnostic imaging of breast: Secondary | ICD-10-CM

## 2021-02-21 ENCOUNTER — Other Ambulatory Visit: Payer: Self-pay | Admitting: Family Medicine

## 2021-02-21 ENCOUNTER — Ambulatory Visit
Admission: RE | Admit: 2021-02-21 | Discharge: 2021-02-21 | Disposition: A | Discharge status: Home / Self Care | Payer: Managed Care, Other (non HMO) | Source: Ambulatory Visit | Attending: Family Medicine | Admitting: Family Medicine

## 2021-02-21 DIAGNOSIS — M5459 Other low back pain: Secondary | ICD-10-CM

## 2021-02-21 DIAGNOSIS — R0781 Pleurodynia: Secondary | ICD-10-CM

## 2021-06-27 ENCOUNTER — Ambulatory Visit (INDEPENDENT_AMBULATORY_CARE_PROVIDER_SITE_OTHER): Payer: Managed Care, Other (non HMO) | Admitting: Podiatry

## 2021-06-27 ENCOUNTER — Other Ambulatory Visit: Payer: Self-pay

## 2021-06-27 DIAGNOSIS — B070 Plantar wart: Secondary | ICD-10-CM

## 2021-06-27 NOTE — Patient Instructions (Signed)
Take dressing off in 8 hours and wash the foot with soap and water. If it is hurting or becomes uncomfortable before the 8 hours, go ahead and remove the bandage and wash the area.  If it blisters, apply antibiotic ointment and a band-aid.  Monitor for any signs/symptoms of infection. Call the office immediately if any occur or go directly to the emergency room. Call with any questions/concerns.   

## 2021-07-03 NOTE — Progress Notes (Signed)
?  Subjective:  ?Patient ID: [name], female    DOB: Jul 16, 1960,  MRN: [id number] ? ?Chief Complaint  ?Patient presents with  ? Plantar Warts  ?   np plantar wart on bottom of right foot/ ref By Dr Parke Simmers  ? ? ?61 y.o. female presents with the above complaint. History confirmed with patient.  This was first noticed in November its been quite painful for her. ? ?Objective:  ?Physical Exam: ?warm, good capillary refill, no trophic changes or ulcerative lesions, normal DP and PT pulses, normal sensory exam, and left forefoot plantar verruca noted. ? ?Assessment:  ? ?1. Verruca plantaris   ? ? ? ?Plan:  ?Patient was evaluated and treated and all questions answered. ? ?Discussed etiology and treatment of verruca plantaris in detail with the patient as well as multiple treatment options including blistering agents, chemotherapeutic agents, surgical excision, laser therapy and the indications and roles of the above.  Today, recommended treatment with Cantharone as noted in procedure note below.  Follow-up in 1 month for reevaluation ? ?Procedure: Destruction of Lesion ?Location: Left foot ?Instrumentation: 15 blade. ?Technique: Debridement of lesion to petechial bleeding. Aperture pad applied around lesion. Small amount of canthrone applied to the base of the lesion. ?Dressing: Dry, sterile, compression dressing. ?Disposition: Patient tolerated procedure well. Advised to leave dressing on for 6-8 hours. Thereafter patient to wash the area with soap and water and applied band-aid. Off-loading pads dispensed.  ? ? ?Return in about 1 month (around 07/28/2021) for to check on wart.  ? ?

## 2021-07-25 ENCOUNTER — Ambulatory Visit (INDEPENDENT_AMBULATORY_CARE_PROVIDER_SITE_OTHER): Payer: Managed Care, Other (non HMO) | Admitting: Podiatry

## 2021-07-25 ENCOUNTER — Encounter: Payer: Self-pay | Admitting: Podiatry

## 2021-07-25 DIAGNOSIS — B070 Plantar wart: Secondary | ICD-10-CM | POA: Diagnosis not present

## 2021-07-25 NOTE — Progress Notes (Signed)
?  Subjective:  ?Patient ID: [name], female    DOB: Aug 03, 1960,  MRN: [id number] ? ?Chief Complaint  ?Patient presents with  ? Plantar Warts  ?    1 mth f/u wart check  ? ? ?61 y.o. female presents with the above complaint. History confirmed with patient.  Doing well the skin because a large painful blister but is now improving again ? ?Objective:  ?Physical Exam: ?warm, good capillary refill, no trophic changes or ulcerative lesions, normal DP and PT pulses, normal sensory exam, and left forefoot plantar verruca has resolved the skin appears normal after debridement of the overlying hyperkeratosis ? ?Assessment:  ? ?1. Verruca plantaris   ? ? ? ?Plan:  ?Patient was evaluated and treated and all questions answered. ? ?Vertigo appears to have healed by this point.  I advised her to watch for signs and symptoms of recurrence and she will return to see me as needed. ? ?Return if symptoms worsen or fail to improve.  ? ?

## 2022-05-01 IMAGING — CR DG RIBS W/ CHEST 3+V BILAT
5 series · 5 of 5 positions shown · non-contrast
Comparison: Chest x-ray 08/28/2008

CLINICAL DATA: Rib pain

EXAM:
BILATERAL RIBS AND CHEST - 4+ VIEW

[w chest pa]
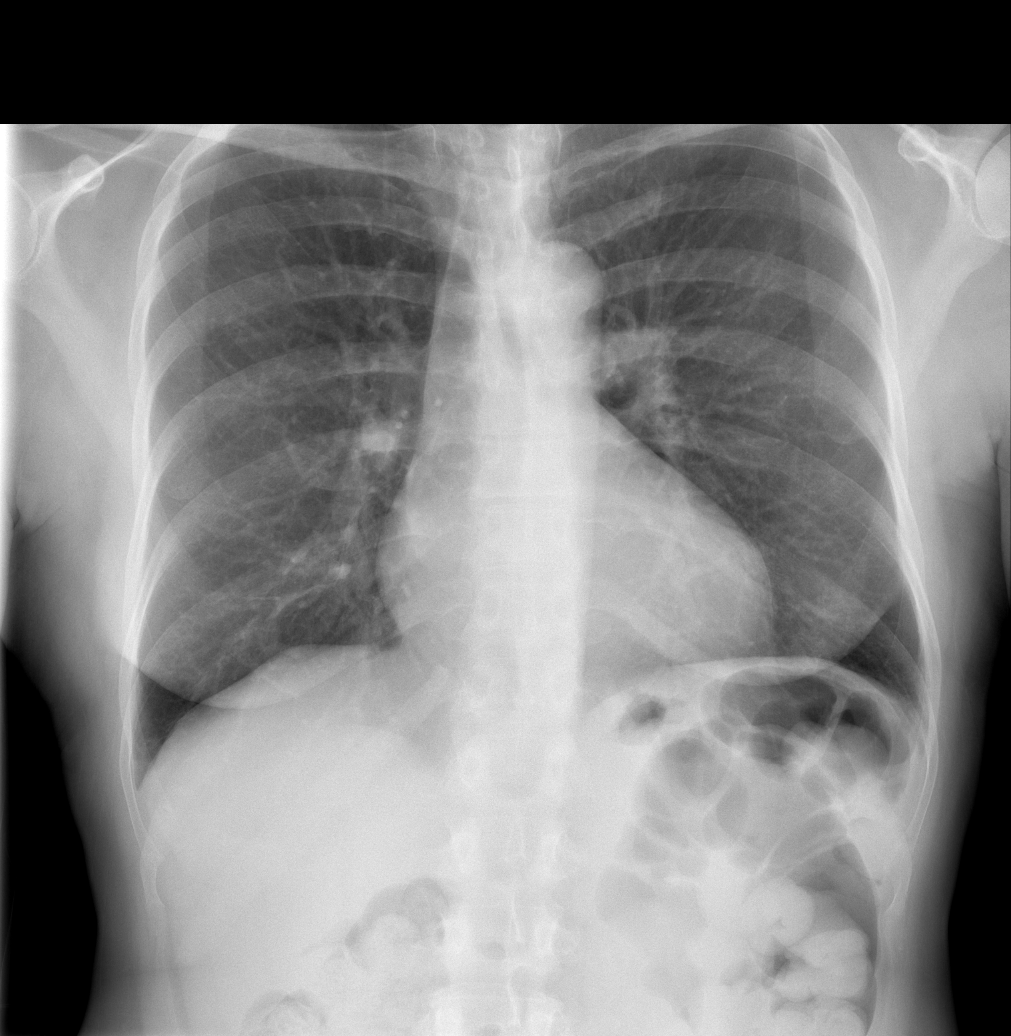

[w ribs ap/pa upper left *]
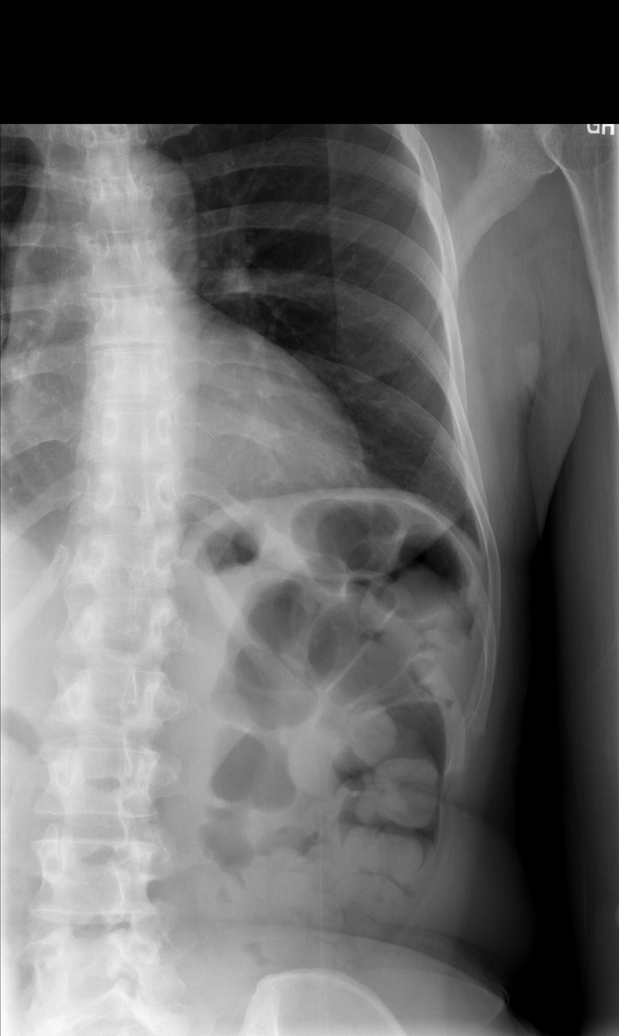

[w ribs ap/pa upper right *]
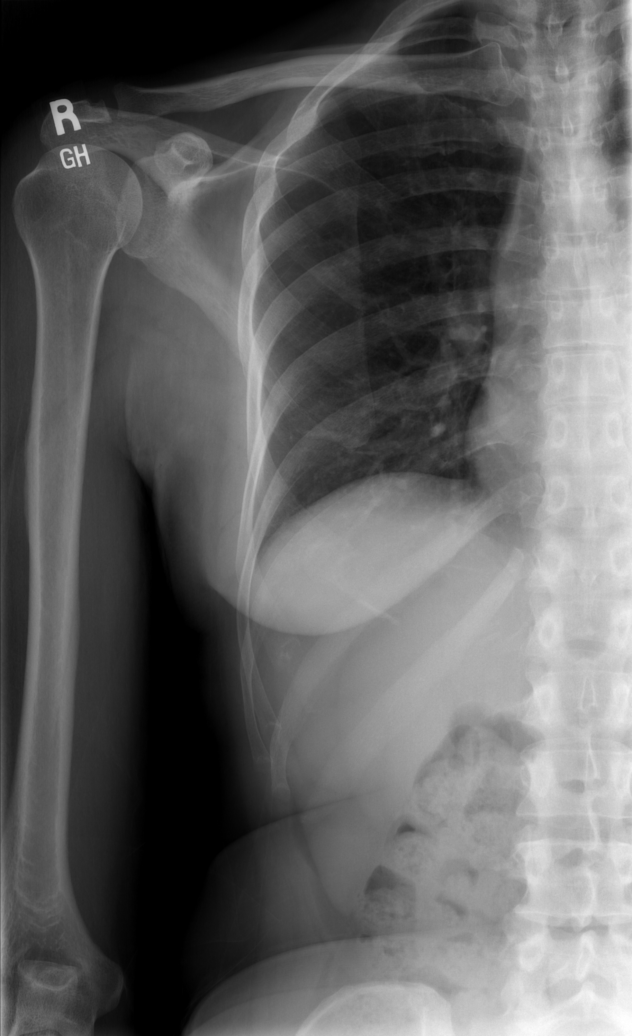

[w ribs oblique left *]
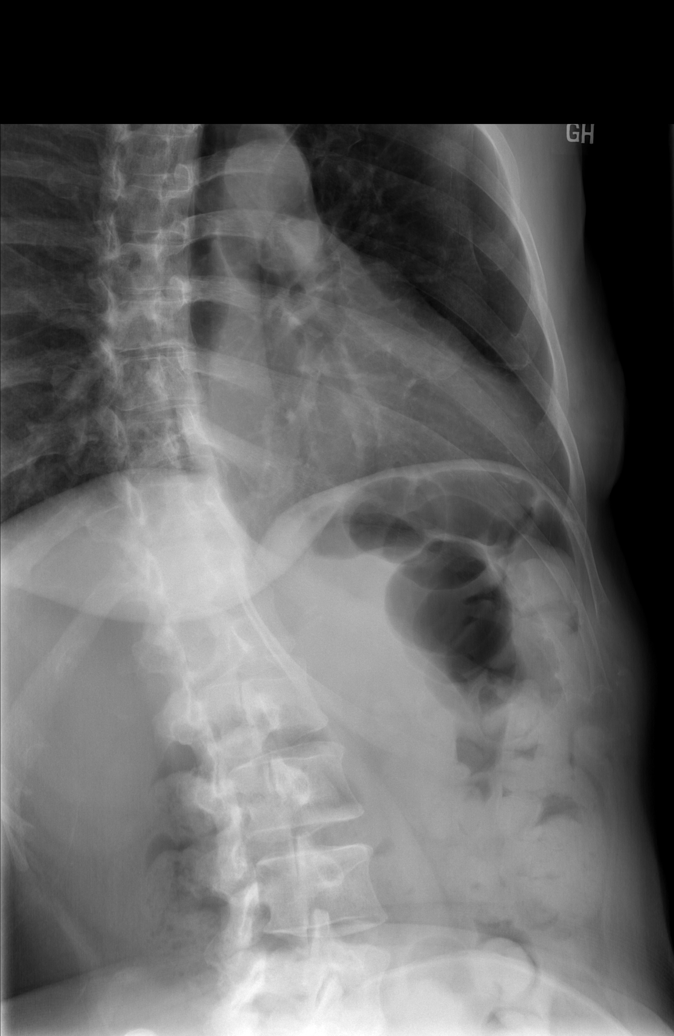

[w ribs oblique right *]
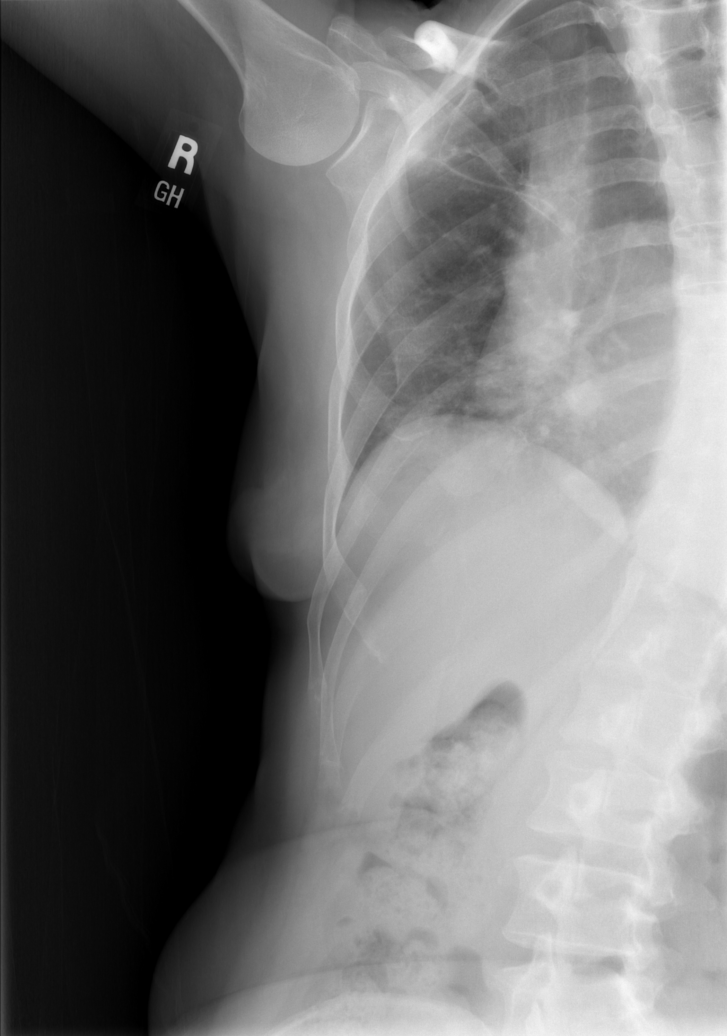

[5 of 5 positions shown; findings below may reference images not displayed]

FINDINGS: No fracture or other bone lesions are seen involving the ribs. There
is no evidence of pneumothorax or pleural effusion. Both lungs are
clear. Heart size and mediastinal contours are within normal limits.
IMPRESSION: Negative.

## 2023-03-16 ENCOUNTER — Emergency Department (HOSPITAL_COMMUNITY): Payer: Managed Care, Other (non HMO)

## 2023-03-16 ENCOUNTER — Encounter (HOSPITAL_COMMUNITY): Payer: Self-pay

## 2023-03-16 ENCOUNTER — Other Ambulatory Visit: Payer: Self-pay

## 2023-03-16 ENCOUNTER — Emergency Department (HOSPITAL_COMMUNITY)
Admission: EM | Admit: 2023-03-16 | Discharge: 2023-03-16 | Disposition: A | Discharge status: Home / Self Care | Payer: Managed Care, Other (non HMO) | Attending: Emergency Medicine | Admitting: Emergency Medicine

## 2023-03-16 DIAGNOSIS — R0602 Shortness of breath: Secondary | ICD-10-CM | POA: Diagnosis present

## 2023-03-16 DIAGNOSIS — R7989 Other specified abnormal findings of blood chemistry: Secondary | ICD-10-CM

## 2023-03-16 DIAGNOSIS — E876 Hypokalemia: Secondary | ICD-10-CM | POA: Diagnosis not present

## 2023-03-16 DIAGNOSIS — Z794 Long term (current) use of insulin: Secondary | ICD-10-CM | POA: Insufficient documentation

## 2023-03-16 DIAGNOSIS — R0600 Dyspnea, unspecified: Secondary | ICD-10-CM | POA: Insufficient documentation

## 2023-03-16 HISTORY — DX: Noninfective gastroenteritis and colitis, unspecified: K52.9

## 2023-03-16 LAB — BASIC METABOLIC PANEL
Anion gap: 13 (ref 5–15)
BUN: 31 mg/dL — ABNORMAL HIGH (ref 8–23)
CO2: 31 mmol/L (ref 22–32)
Calcium: 9.4 mg/dL (ref 8.9–10.3)
Chloride: 93 mmol/L — ABNORMAL LOW (ref 98–111)
Creatinine, Ser: 1.41 mg/dL — ABNORMAL HIGH (ref 0.44–1.00)
GFR, Estimated: 42 mL/min — ABNORMAL LOW (ref >60)
Glucose, Bld: 116 mg/dL — ABNORMAL HIGH (ref 70–99)
Potassium: 2.9 mmol/L — ABNORMAL LOW (ref 3.5–5.1)
Sodium: 137 mmol/L (ref 135–145)

## 2023-03-16 LAB — CBC
HCT: 35.3 % — ABNORMAL LOW (ref 36.0–46.0)
Hemoglobin: 11.4 g/dL — ABNORMAL LOW (ref 12.0–15.0)
MCH: 29.8 pg (ref 26.0–34.0)
MCHC: 32.3 g/dL (ref 30.0–36.0)
MCV: 92.4 fL (ref 80.0–100.0)
Platelets: 185 10*3/uL (ref 150–400)
RBC: 3.82 MIL/uL — ABNORMAL LOW (ref 3.87–5.11)
RDW: 13 % (ref 11.5–15.5)
WBC: 13.3 10*3/uL — ABNORMAL HIGH (ref 4.0–10.5)
nRBC: 0 % (ref 0.0–0.2)

## 2023-03-16 LAB — TROPONIN I (HIGH SENSITIVITY)
Troponin I (High Sensitivity): 6 ng/L (ref <18)
Troponin I (High Sensitivity): 5 ng/L (ref <18)

## 2023-03-16 MED ORDER — SODIUM CHLORIDE (PF) 0.9 % IJ SOLN
INTRAMUSCULAR | Status: AC
  Filled 2023-03-16: qty 50

## 2023-03-16 MED ORDER — IOHEXOL 350 MG/ML SOLN
60.0000 mL | Freq: Once | INTRAVENOUS | Status: AC | PRN
  Administered 2023-03-16: 60 mL via INTRAVENOUS

## 2023-03-16 MED ORDER — POTASSIUM CHLORIDE CRYS ER 20 MEQ PO TBCR
40.0000 meq | EXTENDED_RELEASE_TABLET | Freq: Once | ORAL | Status: AC
  Administered 2023-03-16: 40 meq via ORAL
  Filled 2023-03-16: qty 2

## 2023-03-16 MED ORDER — SODIUM CHLORIDE 0.9 % IV BOLUS
500.0000 mL | Freq: Once | INTRAVENOUS | Status: AC
  Administered 2023-03-16: 500 mL via INTRAVENOUS

## 2023-03-16 NOTE — ED Provider Notes (Signed)
[place] [facility] AT [facility] Provider Note   CSN: [id number] Arrival date & time: 03/16/23  4098     History  Chief Complaint  Patient presents with   Shortness of Breath    [name] is a 62 y.o. female.  The history is provided by the patient.  Shortness of Breath Severity:  Moderate Onset quality:  Gradual Timing: worst lying flat at night for the past month. Progression:  Waxing and waning Chronicity:  New Context comment:  Recent viral infection Relieved by:  Nothing Worsened by:  Nothing Ineffective treatments:  None tried Associated symptoms: no fever, no syncope, no swollen glands, no vomiting and no wheezing   Risk factors: no hx of PE/DVT and no obesity        Home Medications Prior to Admission medications   Medication Sig Start Date End Date Taking? Authorizing Provider  rosuvastatin (CRESTOR) 10 MG tablet Take 10 mg by mouth daily. 05/01/21  Yes [provider]  valsartan-hydrochlorothiazide (DIOVAN-HCT) 160-12.5 MG tablet Take 1 tablet by mouth every morning. 01/13/23  Yes [provider]  Cholecalciferol 1.25 MG (50000 UT) capsule TK 1 C PO EVERY SUNDAY 10/18/16   [provider]  folic acid (FOLVITE) 1 MG tablet Take 1 tablet by mouth daily. Patient not taking: Reported on 03/16/2023 05/31/21   [provider]  HUMIRA, 2 PEN, 40 MG/0.4ML pen Inject 40 mg into the skin every 14 (fourteen) days.    [provider]  losartan-hydrochlorothiazide (HYZAAR) 100-12.5 MG tablet TK 1 T PO QD Patient not taking: Reported on 03/16/2023 11/02/16   [provider]  pantoprazole (PROTONIX) 40 MG tablet Take 1 tablet by mouth daily. Patient not taking: Reported on 03/16/2023 01/03/19   [provider]  sulfaSALAzine (AZULFIDINE) 500 MG tablet Take 1,000 mg by mouth 2 (two) times daily. Patient not taking: Reported on 03/16/2023 05/02/21   [provider]       Allergies    Mesalamine    Review of Systems   Review of Systems  Constitutional:  Negative for fever.  HENT:  Negative for facial swelling.   Eyes:  Negative for redness.  Respiratory:  Positive for shortness of breath. Negative for wheezing.   Cardiovascular:  Negative for syncope.  Gastrointestinal:  Negative for vomiting.  All other systems reviewed and are negative.   Physical Exam Updated Vital Signs BP 108/73   Pulse 67   Temp 98.6 F (37 C)   Resp 15   Ht 5\' 7"  (1.702 m)   Wt 63.5 kg   SpO2 99%   BMI 21.93 kg/m  Physical Exam Vitals and nursing note reviewed.  Constitutional:      General: She is not in acute distress.    Appearance: Normal appearance. She is well-developed. She is not diaphoretic.  HENT:     Head: Normocephalic and atraumatic.     Nose: Nose normal.  Eyes:     Pupils: Pupils are equal, round, and reactive to light.  Cardiovascular:     Rate and Rhythm: Normal rate and regular rhythm.     Pulses: Normal pulses.     Heart sounds: Normal heart sounds.  Pulmonary:     Effort: Pulmonary effort is normal. No respiratory distress.     Breath sounds: Normal breath sounds.  Abdominal:     General: Bowel sounds are normal. There is no distension.     Palpations: Abdomen is soft.     Tenderness: There  is no abdominal tenderness. There is no guarding or rebound.  Musculoskeletal:        General: Normal range of motion.     Cervical back: Normal range of motion and neck supple.  Skin:    General: Skin is warm and dry.     Capillary Refill: Capillary refill takes less than 2 seconds.     Findings: No erythema or rash.  Neurological:     General: No focal deficit present.     Mental Status: She is alert and oriented to person, place, and time.     Deep Tendon Reflexes: Reflexes normal.  Psychiatric:        Mood and Affect: Mood normal.     ED Results / Procedures / Treatments   Labs (all labs ordered are listed, but only abnormal results  are displayed) Results for orders placed or performed during the hospital encounter of 03/16/23  Basic metabolic panel  Result Value Ref Range   Sodium 137 135 - 145 mmol/L   Potassium 2.9 (L) 3.5 - 5.1 mmol/L   Chloride 93 (L) 98 - 111 mmol/L   CO2 31 22 - 32 mmol/L   Glucose, Bld 116 (H) 70 - 99 mg/dL   BUN 31 (H) 8 - 23 mg/dL   Creatinine, Ser 1.61 (H) 0.44 - 1.00 mg/dL   Calcium 9.4 8.9 - 09.6 mg/dL   GFR, Estimated 42 (L) >60 mL/min   Anion gap 13 5 - 15  CBC  Result Value Ref Range   WBC 13.3 (H) 4.0 - 10.5 K/uL   RBC 3.82 (L) 3.87 - 5.11 MIL/uL   Hemoglobin 11.4 (L) 12.0 - 15.0 g/dL   HCT 04.5 (L) 40.9 - 81.1 %   MCV 92.4 80.0 - 100.0 fL   MCH 29.8 26.0 - 34.0 pg   MCHC 32.3 30.0 - 36.0 g/dL   RDW 91.4 78.2 - 95.6 %   Platelets 185 150 - 400 K/uL   nRBC 0.0 0.0 - 0.2 %  Troponin I (High Sensitivity)  Result Value Ref Range   Troponin I (High Sensitivity) 6 <18 ng/L  Troponin I (High Sensitivity)  Result Value Ref Range   Troponin I (High Sensitivity) 5 <18 ng/L   CT Angio Chest PE W and/or Wo Contrast  Result Date: 03/16/2023 CLINICAL DATA:  Syncope/presyncope, cerebrovascular cause suspected, with shortness of breath and chest pain for 1 week and leukocytosis. EXAM: CT ANGIOGRAPHY CHEST WITH CONTRAST TECHNIQUE: Multidetector CT imaging of the chest was performed using the standard protocol during bolus administration of intravenous contrast. Multiplanar CT image reconstructions and MIPs were obtained to evaluate the vascular anatomy. RADIATION DOSE REDUCTION: This exam was performed according to the departmental dose-optimization program which includes automated exposure control, adjustment of the mA and/or kV according to patient size and/or use of iterative reconstruction technique. CONTRAST:  60mL OMNIPAQUE IOHEXOL 350 MG/ML SOLN COMPARISON:  AP and lateral chest today, PA chest and rib series 02/21/2021, and chest CT without contrast 07/22/2008. FINDINGS:  Cardiovascular: There is no evidence of pulmonary arterial dilatation or embolic disease. The cardiac size is normal. There is no pericardial effusion. The aorta and great vessels are unremarkable. Pulmonary veins are nondistended. There are no visible coronary calcifications. Mediastinum/Nodes: No enlarged mediastinal, hilar, or axillary lymph nodes. Thyroid gland, trachea, and esophagus demonstrate no significant findings. Both main bronchi are clear. Lungs/Pleura: There are mild reticular scarring changes at both lung apices. Mild elevation right hemidiaphragm. There are few linear scar-like opacities in  both lung bases. There is no consolidation, nodule or effusion. Upper Abdomen: No acute abnormality. Musculoskeletal: No chest wall abnormality. No acute or significant osseous findings. Review of the MIP images confirms the above findings. IMPRESSION: 1. No evidence of arterial dilatation or embolic disease. 2. Mild scarring changes in the lungs. No acute chest CT or CTA findings. Electronically Signed   By: Almira Bar M.D.   On: 03/16/2023 04:57   DG Chest 2 View  Result Date: 03/16/2023 CLINICAL DATA:  Chest pain or shortness of breath EXAM: CHEST - 2 VIEW COMPARISON:  02/21/2021 FINDINGS: The heart size and mediastinal contours are within normal limits. Both lungs are clear. The visualized skeletal structures are unremarkable. IMPRESSION: No active cardiopulmonary disease. Electronically Signed   By: Minerva Fester M.D.   On: 03/16/2023 02:55     EKG EKG Interpretation Date/Time:  Friday March 16 2023 02:44:21 EST Ventricular Rate:  78 PR Interval:  156 QRS Duration:  95 QT Interval:  373 QTC Calculation: 425 R Axis:   80  Text Interpretation: Sinus rhythm Probable left ventricular hypertrophy Confirmed by Ryenne Lynam ([id number]) on 03/16/2023 2:51:02 AM  Radiology CT Angio Chest PE W and/or Wo Contrast  Result Date: 03/16/2023 CLINICAL DATA:  Syncope/presyncope, cerebrovascular  cause suspected, with shortness of breath and chest pain for 1 week and leukocytosis. EXAM: CT ANGIOGRAPHY CHEST WITH CONTRAST TECHNIQUE: Multidetector CT imaging of the chest was performed using the standard protocol during bolus administration of intravenous contrast. Multiplanar CT image reconstructions and MIPs were obtained to evaluate the vascular anatomy. RADIATION DOSE REDUCTION: This exam was performed according to the departmental dose-optimization program which includes automated exposure control, adjustment of the mA and/or kV according to patient size and/or use of iterative reconstruction technique. CONTRAST:  60mL OMNIPAQUE IOHEXOL 350 MG/ML SOLN COMPARISON:  AP and lateral chest today, PA chest and rib series 02/21/2021, and chest CT without contrast 07/22/2008. FINDINGS: Cardiovascular: There is no evidence of pulmonary arterial dilatation or embolic disease. The cardiac size is normal. There is no pericardial effusion. The aorta and great vessels are unremarkable. Pulmonary veins are nondistended. There are no visible coronary calcifications. Mediastinum/Nodes: No enlarged mediastinal, hilar, or axillary lymph nodes. Thyroid gland, trachea, and esophagus demonstrate no significant findings. Both main bronchi are clear. Lungs/Pleura: There are mild reticular scarring changes at both lung apices. Mild elevation right hemidiaphragm. There are few linear scar-like opacities in both lung bases. There is no consolidation, nodule or effusion. Upper Abdomen: No acute abnormality. Musculoskeletal: No chest wall abnormality. No acute or significant osseous findings. Review of the MIP images confirms the above findings. IMPRESSION: 1. No evidence of arterial dilatation or embolic disease. 2. Mild scarring changes in the lungs. No acute chest CT or CTA findings. Electronically Signed   By: Almira Bar M.D.   On: 03/16/2023 04:57   DG Chest 2 View  Result Date: 03/16/2023 CLINICAL DATA:  Chest pain or  shortness of breath EXAM: CHEST - 2 VIEW COMPARISON:  02/21/2021 FINDINGS: The heart size and mediastinal contours are within normal limits. Both lungs are clear. The visualized skeletal structures are unremarkable. IMPRESSION: No active cardiopulmonary disease. Electronically Signed   By: Minerva Fester M.D.   On: 03/16/2023 02:55    Procedures Procedures    Medications Ordered in ED Medications  potassium chloride SA (KLOR-CON M) CR tablet 40 mEq (40 mEq Oral Given 03/16/23 0406)  iohexol (OMNIPAQUE) 350 MG/ML injection 60 mL (60 mLs Intravenous Contrast Given 03/16/23 0421)  sodium chloride 0.9 % bolus 500 mL (0 mLs Intravenous Stopped 03/16/23 0524)    ED Course/ Medical Decision Making/ A&P                                 Medical Decision Making Patient with SOB since viral infection beginning of November worst lying flat presents with SOB  Amount and/or Complexity of Data Reviewed Independent Historian: spouse    Details: See above  External Data Reviewed: labs and notes.    Details: Previous notes reviewed baseline creatinine 0.79 Labs: ordered.    Details: 2 negative troponins 6/5.  Normal sodium 137, low potassium 2.9, creatinine elevated 1.41. white count slight elevation 13.3, hemoglobin low 11.4, normal platelet count  Radiology: ordered and independent interpretation performed.    Details: No PE or PNA by me on CTA ECG/medicine tests: ordered and independent interpretation performed.  Risk Prescription drug management. Risk Details: Ruled out for PE and PNA in the ED based on ongoing symptoms and negative CTA.  Ruled out for MI in the ED, heart score is 2 low risk for MACE.  Will refer to cardiology for ongoing testing.  Stable for discharge.  Strict return precautions     Final Clinical Impression(s) / ED Diagnoses Final diagnoses:  Hypokalemia  Elevated serum creatinine  Dyspnea, unspecified type   Return for intractable cough, coughing up blood, fevers >  100.4 unrelieved by medication, shortness of breath, intractable vomiting, chest pain, shortness of breath, weakness, numbness, changes in speech, facial asymmetry, abdominal pain, passing out, Inability to tolerate liquids or food, cough, altered mental status or any concerns. No signs of systemic illness or infection. The patient is nontoxic-appearing on exam and vital signs are within normal limits.  I have reviewed the triage vital signs and the nursing notes. Pertinent labs & imaging results that were available during my care of the patient were reviewed by me and considered in my medical decision making (see chart for details). After history, exam, and medical workup I feel the patient has been appropriately medically screened and is safe for discharge home. Pertinent diagnoses were discussed with the patient. Patient was given return precautions.  Rx / DC Orders Rx / DC Orders ED Discharge Orders          Ordered    Ambulatory referral to Cardiology       Comments: If you have not heard from the Cardiology office within the next 72 hours please call [phone].   03/16/23 0527              Tauren Delbuono, MD 03/16/23 [id number]

## 2023-03-16 NOTE — ED Triage Notes (Signed)
Pt reports with shob and chest pain x 1 week.
